# Patient Record
Sex: Male | Born: 1960 | Race: Black or African American | Hispanic: No | Marital: Single
Health system: Southern US, Community
[De-identification: ages and names within clinical notes are randomized; demographics above are authoritative.]

---

## 2006-09-02 ENCOUNTER — Emergency Department (HOSPITAL_COMMUNITY): Admission: EM | Admit: 2006-09-02 | Discharge: 2006-09-02 | Payer: Self-pay | Admitting: Emergency Medicine

## 2006-12-17 ENCOUNTER — Emergency Department (HOSPITAL_COMMUNITY): Admission: EM | Admit: 2006-12-17 | Discharge: 2006-12-17 | Payer: Self-pay | Admitting: Emergency Medicine

## 2007-01-01 ENCOUNTER — Encounter: Admission: RE | Admit: 2007-01-01 | Discharge: 2007-01-01 | Payer: Self-pay | Admitting: Occupational Medicine
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
## 2007-09-19 IMAGING — CR DG CHEST 1V
1 series · 1 of 1 positions shown · non-contrast
Comparison: none

CLINICAL DATA: Smoker.  Pre-employment physical. 
 CHEST - 1 VIEW:

[view not recorded]
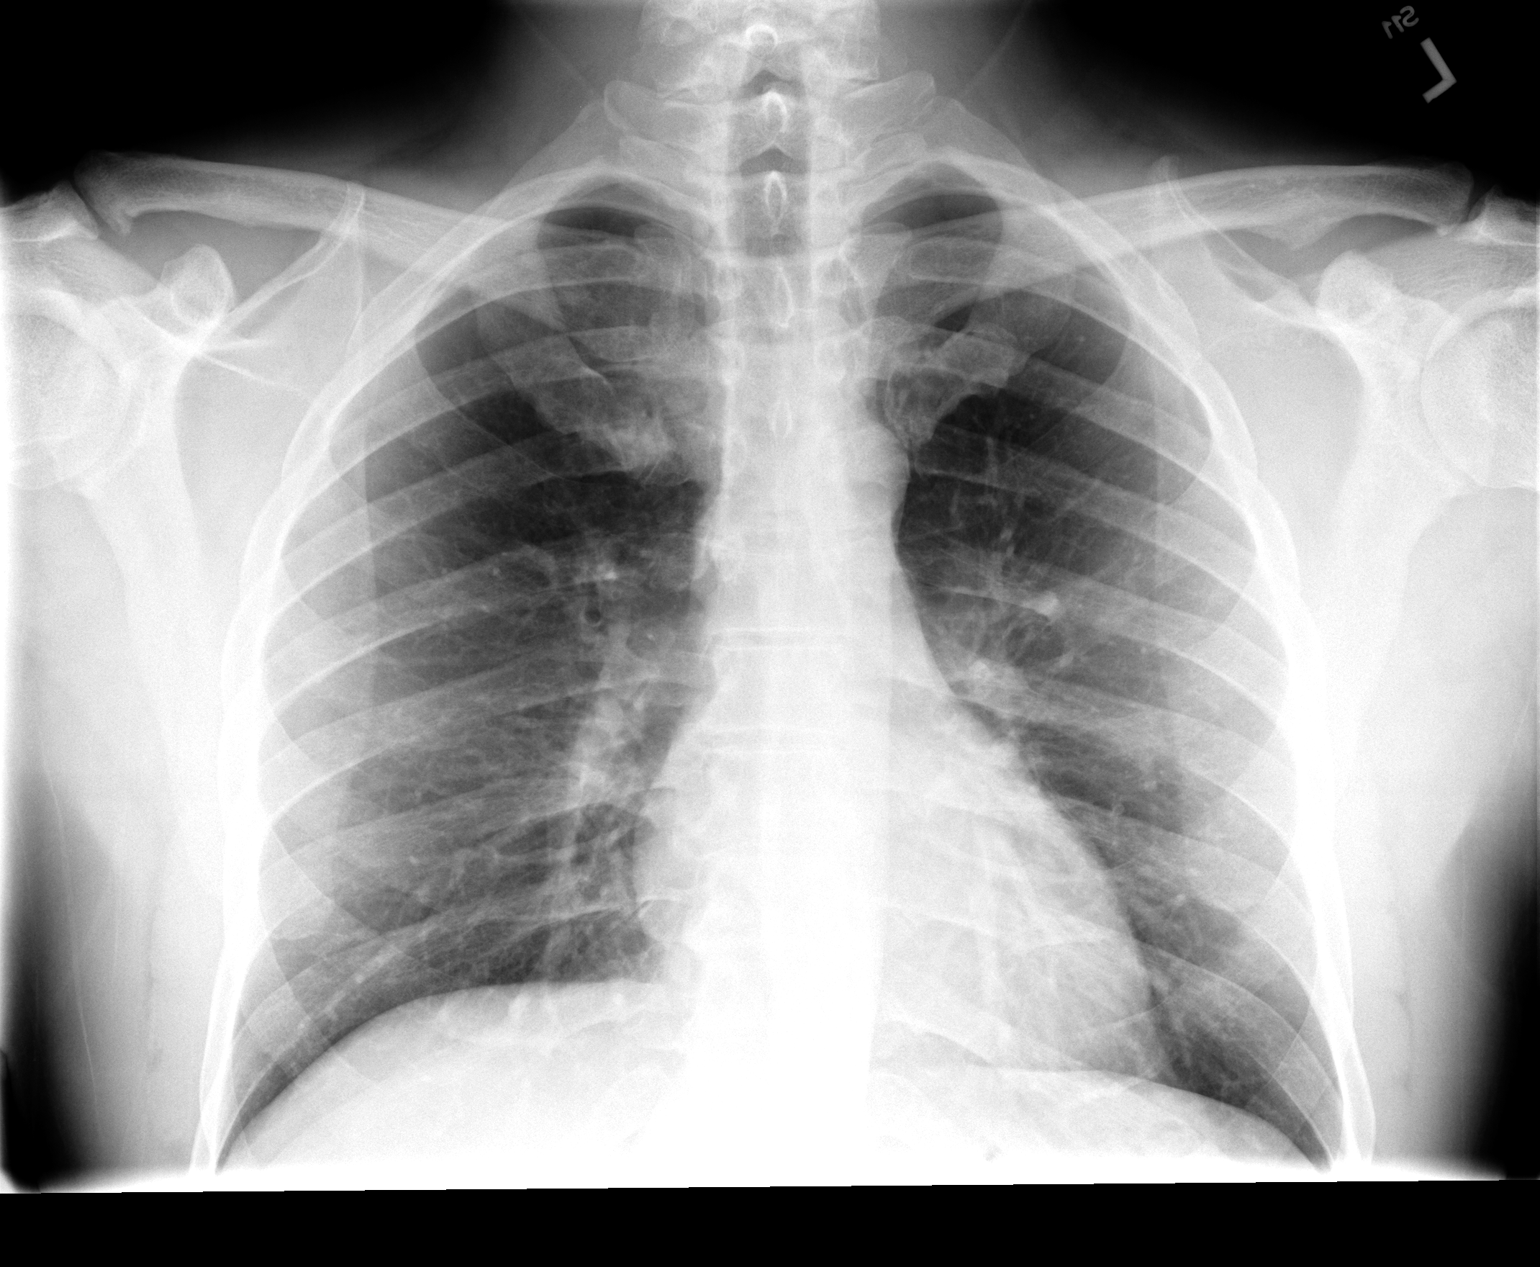

[1 of 1 positions shown; findings below may reference images not displayed]

FINDINGS: The heart size and mediastinal contours are within normal limits.  Both lungs are clear.
IMPRESSION: No active disease.

## 2010-06-29 ENCOUNTER — Encounter: Payer: Self-pay | Admitting: Internal Medicine

## 2010-08-17 ENCOUNTER — Ambulatory Visit: Payer: Self-pay | Admitting: Internal Medicine

## 2010-08-17 DIAGNOSIS — F17200 Nicotine dependence, unspecified, uncomplicated: Secondary | ICD-10-CM

## 2010-08-17 DIAGNOSIS — J309 Allergic rhinitis, unspecified: Secondary | ICD-10-CM | POA: Insufficient documentation

## 2010-08-17 DIAGNOSIS — J45909 Unspecified asthma, uncomplicated: Secondary | ICD-10-CM | POA: Insufficient documentation

## 2010-09-03 ENCOUNTER — Ambulatory Visit: Payer: Self-pay | Admitting: Internal Medicine

## 2010-12-11 NOTE — Assessment & Plan Note (Signed)
Summary: Pulmonary new pt eval/ abn spirometry   Visit Type:  Initial Consult Copy to:  Dr. Fleet Contras Primary Provider/Referring Provider:  Dr. Fleet Contras  CC:  Abnormal PFT.  History of Present Illness: 69 yobm active smoker wears respirator for environment cleanups since 2008 did not pass spirometry in 2011 so referred to pulmonary clinic  August 17, 2010  1st pulmonary office eval cc mild hb only with spicey food new complaint also having at hs but no cough or sob.  does note some "wheezing " when he lies down at night but does not bother his sleep. no early am exac of cough or wheeze.  Pt denies any significant sore throat, dysphagia, itching, sneezing,  nasal congestion or excess secretions,  fever, chills, sweats, unintended wt loss, pleuritic or exertional cp, hempoptysis, change in activity tolerance  orthopnea pnd or leg swelling Pt also denies any obvious fluctuation in symptoms with weather or environmental change or other alleviating or aggravating factors.       Preventive Screening-Counseling & Management  Alcohol-Tobacco     Smoking Status: current     Smoking Cessation Counseling: yes  Current Medications (verified): 1)  None  Allergies (verified): No Known Drug Allergies  Past History:  Past Medical History: Allergic Rhinitis Asthms vs  COPD     - Spirometry 06/29/10 FEV1 2.17 (52%) ratio 67 before initating rx with symbicort August 17, 2010     - Repeat pft's on symbicort rec August 17, 2010   Family History: Emphysema- Brother  was smoker  Lung CA- Brother (smoker) Mother (was a smoker) Heart dz- Mother  Social History: Married Children Current smoker since 11.  Smokes 1/2 ppd. ETOH- beer every wk Environmental work Smoking Status:  current  Review of Systems       The patient complains of acid heartburn, indigestion, itching, and rash.  The patient denies shortness of breath with activity, shortness of breath at rest, productive cough,  non-productive cough, coughing up blood, chest pain, irregular heartbeats, loss of appetite, weight change, abdominal pain, difficulty swallowing, sore throat, tooth/dental problems, headaches, nasal congestion/difficulty breathing through nose, sneezing, ear ache, anxiety, depression, hand/feet swelling, joint stiffness or pain, change in color of mucus, and fever.    Vital Signs:  Patient profile:   50 year old male Height:      74 inches Weight:      222 pounds BMI:     28.61 O2 Sat:      97 % on Room air Temp:     98.2 degrees F oral Pulse rate:   59 / minute BP sitting:   120 / 78  (left arm)  Vitals Entered By: Vernie Murders (August 17, 2010 10:37 AM)  O2 Flow:  Room air  Physical Exam  Additional Exam:  amb bm nad wt 222 August 17, 2010 HEENT mild turbinate edema.  Oropharynx no thrush or excess pnd or cobblestoning.  No JVD or cervical adenopathy. Mild accessory muscle hypertrophy. Trachea midline, nl thryroid. Chest was hyperinflated by percussion with diminished breath sounds and moderate increased exp time with trace bilateral wheeze. Hoover sign positive at mid inspiration. Regular rate and rhythm without murmur gallop or rub or increase P2 or edema.  Abd: no hsm, nl excursion. Ext warm without cyanosis or clubbing.     CXR  Procedure date:  08/17/2010  Findings:      Comparison: 01/01/2007   Findings: Trachea is midline.  Heart size normal.  Lungs are clear. No pleural fluid.  Impression & Recommendations:  Problem # 1:  ASTHMA, UNSPECIFIED (ICD-493.90) vs copd early stage related to smoking     DDX of  difficult airways managment all start with A and  include Adherence, Ace Inhibitors, Acid Reflux, Active Sinus Disease, Alpha 1 Antitripsin deficiency, Anxiety masquerading as Airways dz,  ABPA,  allergy(esp in young), Aspiration (esp in elderly), Adverse effects of DPI,  Active smokers, plus one B  = Beta blocker use..   Adherence:  try symbicort,  I spent  extra time with the patient today explaining optimal mdi  technique.  This improved from  25 to 50%  Active smoking see #2  Acid reflux: diet reviewed  Problem # 2:  SMOKER (ICD-305.1)  Discussed but not ready to committ to quit at this point - emphasized risks involved in continuing smoking and that patient should consider these in the context of the cost of smoking relative to the benefit obtained.   I took this opportunity to educate the patient regarding the consequences of smoking in airway disorders based on all the data we have from the multiple national lung health studies indicating that smoking cessation, not choice of inhalers or physicians, is the most important aspect of care.    Orders: Consultation Level V [phone])  Medications Added to Medication List This Visit: 1)  Symbicort 160-4.5 Mcg/act Aero (Budesonide-formoterol fumarate) .... 2 puffs first thing  in am and 2 puffs again in pm about 12 hours later  Other Orders: T-2 View CXR ([id number])  Patient Instructions: 1)  Start symbicort 160 2 puffs first thing  in am and 2 puffs again in pm about 12 hours later  2)  Work on perfecting inhaler technique:  relax and blow all the way out then take a nice smooth deep breath back in, triggering the inhaler at same time you start breathing in  3)  No reason you can't wear a respirator and work  4)  Please schedule a follow-up appointment in 2  weeks, sooner if needed with PFT's on return

## 2010-12-11 NOTE — Miscellaneous (Signed)
Summary: Orders Update pft charges  Clinical Lists Changes  Orders: Added new Service order of Carbon Monoxide diffusing w/capacity (94720) - Signed Added new Service order of Lung Volumes (94240) - Signed Added new Service order of Spirometry (Pre & Post) (94060) - Signed 

## 2010-12-11 NOTE — Assessment & Plan Note (Signed)
Summary: Pulmonary/ final summary f/u ov with hfa 90%   Copy to:  Dr. Fleet Contras Primary Provider/Referring Provider:  Dr. Fleet Contras  CC:  Heart burn and wheezing- somewhat improved.  History of Present Illness: 34 yobm active smoker wears respirator for environment cleanups since 2008 did not pass spirometry in 2011 so referred to pulmonary clinic  August 17, 2010  1st pulmonary office eval cc mild hb only with spicey food new complaint also having at hs but no cough or sob.  does note some "wheezing " when he lies down at night but does not bother his sleep. no early am exac of cough or wheeze. Start symbicort 160 2 puffs first thing  in am and 2 puffs again in pm about 12 hours later  Work on perfecting inhaler technique: No reason you can't wear a respirator and work  September 03, 2010  ov  still smoking. Heart burn and wheezing- somewhat improved on symbicort, still some audible wheezing.  Pt denies any significant sore throat, dysphagia, itching, sneezing,  nasal congestion or excess secretions,  fever, chills, sweats, unintended wt loss, pleuritic or exertional cp, hempoptysis, change in activity tolerance  orthopnea pnd or leg swelling .  no cough   Current Medications (verified): 1)  Symbicort 160-4.5 Mcg/act  Aero (Budesonide-Formoterol Fumarate) .... 2 Puffs First Thing  in Am and 2 Puffs Again in Pm About 12 Hours Later  Allergies (verified): No Known Drug Allergies  Past History:  Past Medical History: Allergic Rhinitis Asthms vs  COPD     - Spirometry 06/29/10 FEV1 2.17 (52%) ratio 67 before initating rx with symbicort August 17, 2010     - Repeat pft's on symbicort September 03, 2010 wnl x DLC0 56%  > corrects to 152, insp loop truncated  Clinical Reports Reviewed:  CXR:  08/17/2010: CXR Results:  Comparison: 01/01/2007   Findings: Trachea is midline.  Heart size normal.  Lungs are clear. No pleural fluid.   Vital Signs:  Patient profile:   50 year old  male Weight:      221 pounds O2 Sat:      96 % on Room air Temp:     98.2 degrees F oral Pulse rate:   77 / minute BP sitting:   110 / 68  (left arm)  Vitals Entered By: Vernie Murders (September 03, 2010 12:08 PM)  O2 Flow:  Room air  Physical Exam  Additional Exam:  amb bm nad with classic pseudowheeze resolves with purse lip maneuver  wt 222 August 17, 2010 > 221 September 03, 2010  HEENT mild turbinate edema.  Oropharynx no thrush or excess pnd or cobblestoning.  No JVD or cervical adenopathy. Mild accessory muscle hypertrophy. Trachea midline, nl thryroid. Chest was hyperinflated by percussion with diminished breath sounds and moderate increased exp time with trace bilateral wheeze. Hoover sign positive at mid inspiration. Regular rate and rhythm without murmur gallop or rub or increase P2 or edema.  Abd: no hsm, nl excursion. Ext warm without cyanosis or clubbing.     Impression & Recommendations:  Problem # 1:  ASTHMA, UNSPECIFIED (ICD-493.90) Normalization of pft's on symbicort excludes copd.  I spent extra time with the patient today explaining optimal mdi  technique.  This improved from  50-90%   Pseudowheeze is likely GERD related  Problem # 2:  SMOKER (ICD-305.1)  Discussed but not ready to committ to quit at this point - emphasized risks involved in continuing smoking and  that patient should consider these in the context of the cost of smoking relative to the benefit obtained.   I took this opportunity to educate the patient regarding the consequences of smoking in airway disorders based on all the data we have from the multiple national lung health studies indicating that smoking cessation, not choice of inhalers or physicians, is the most important aspect of care.    I reviewed the Flethcher curve with him  that basically says if you quit smoking when your best day FEV1 is still well preserved it is highly unlikely you will progress to severe disease and informed the  patient there was no medication on the market that has proven to change the curve or the likelihood of progression   Orders: Est. Patient Level IV (16109)  Patient Instructions: 1)  You minimal COPD which will progress regardless of what medication you take or what doctor  2)  Stay on symbicort 160 2 puffs first thing  in am and 2 puffs again in pm about 12 hours later  3)  GERD (REFLUX)  is a common cause of respiratory symptoms. It commonly presents without heartburn and can be treated with medication, but also with lifestyle changes including avoidance of late meals, excessive alcohol, smoking cessation, and avoid fatty foods, chocolate, peppermint, colas, red wine, and acidic juices such as orange juice. NO MINT OR MENTHOL PRODUCTS SO NO COUGH DROPS  4)  USE SUGARLESS CANDY INSTEAD (jolley ranchers)  5)  NO OIL BASED VITAMINS  6)  Prilosec can be used Take  one 30-60 min before first meal of the day x 4 weeks (= prevacid 30) 7)  Committ to quit smoking  Prescriptions: SYMBICORT 160-4.5 MCG/ACT  AERO (BUDESONIDE-FORMOTEROL FUMARATE) 2 puffs first thing  in am and 2 puffs again in pm about 12 hours later  #1 x 11   Entered and Authorized by:   Nyoka Cowden MD   Signed by:   Nyoka Cowden MD on 09/03/2010   Method used:   Electronically to        [facility] [phone]* (retail)       [address]       [place], [place]  [address]       Ph: [phone]       Fax: [phone]   RxID:   [id number]

## 2011-05-05 IMAGING — CR DG CHEST 2V
2 series · 2 of 2 positions shown · non-contrast
Comparison: 01/01/2007

CLINICAL DATA: Wheezing, asthma, smoker.

CHEST - 2 VIEW

[view not recorded (1 of 2)]
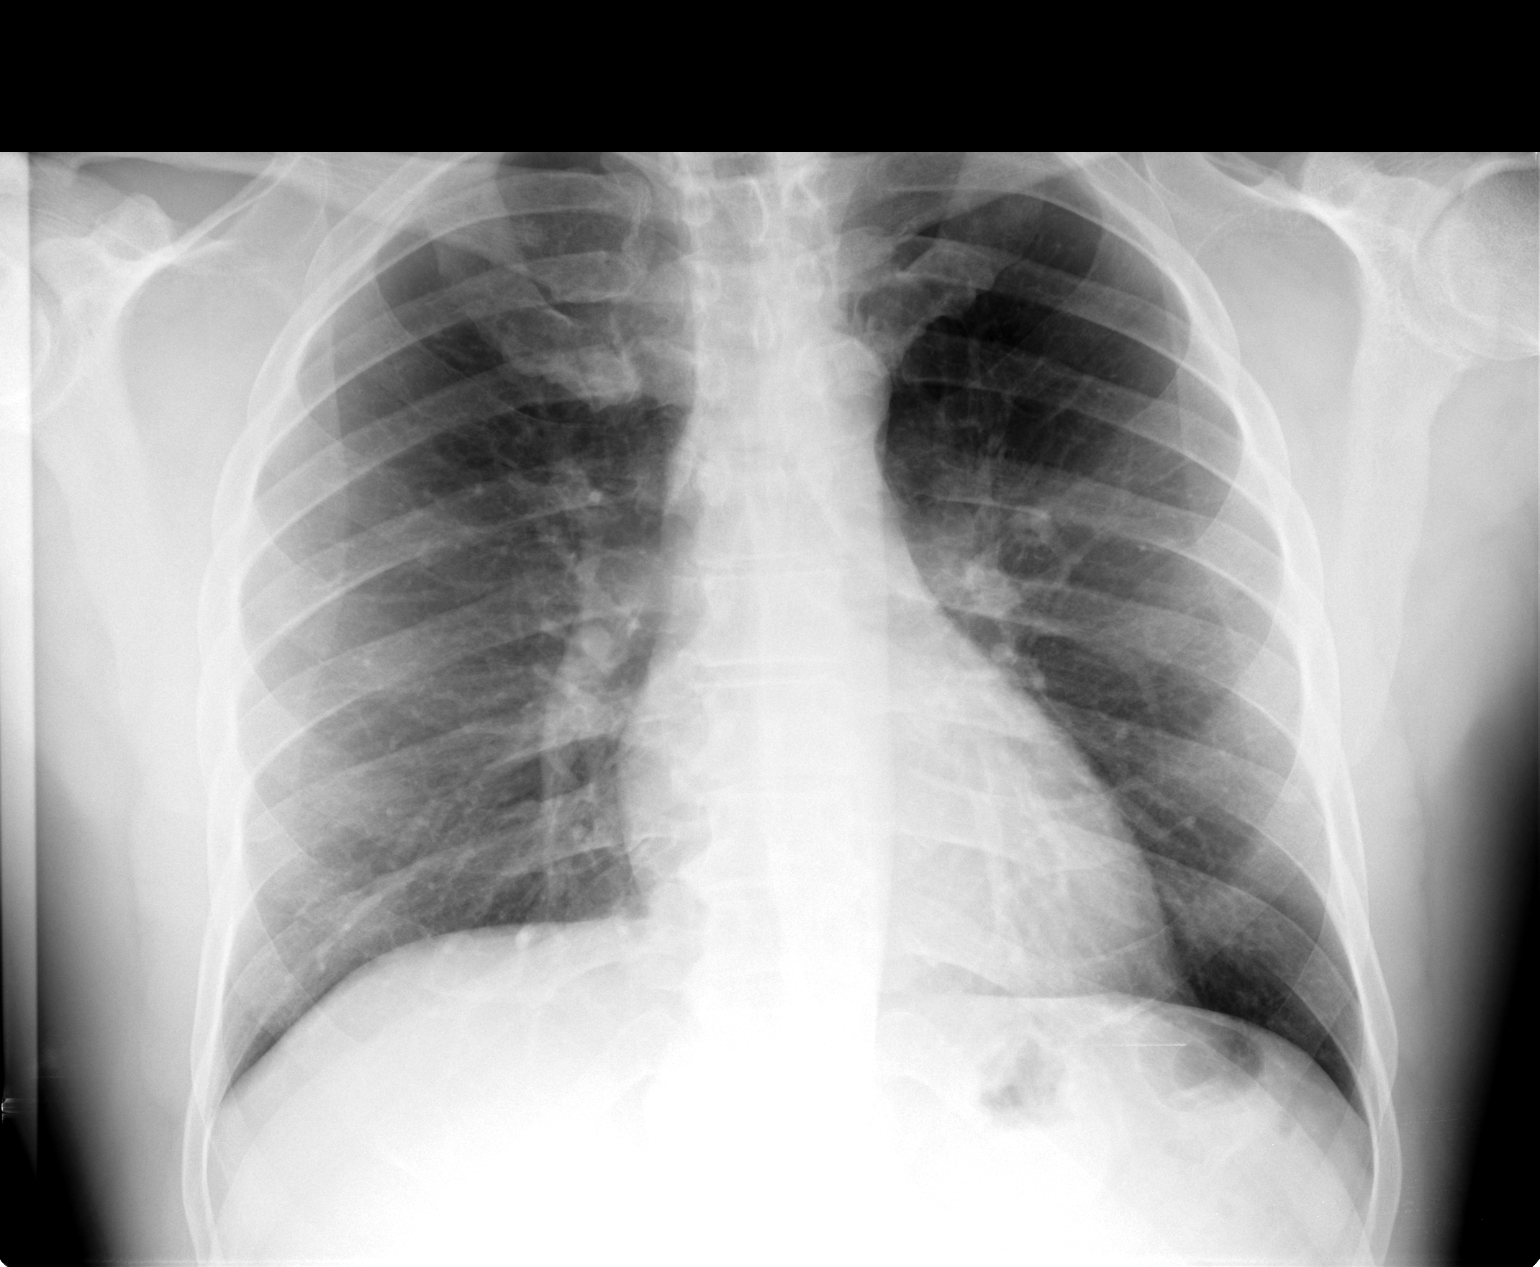

[view not recorded (2 of 2)]
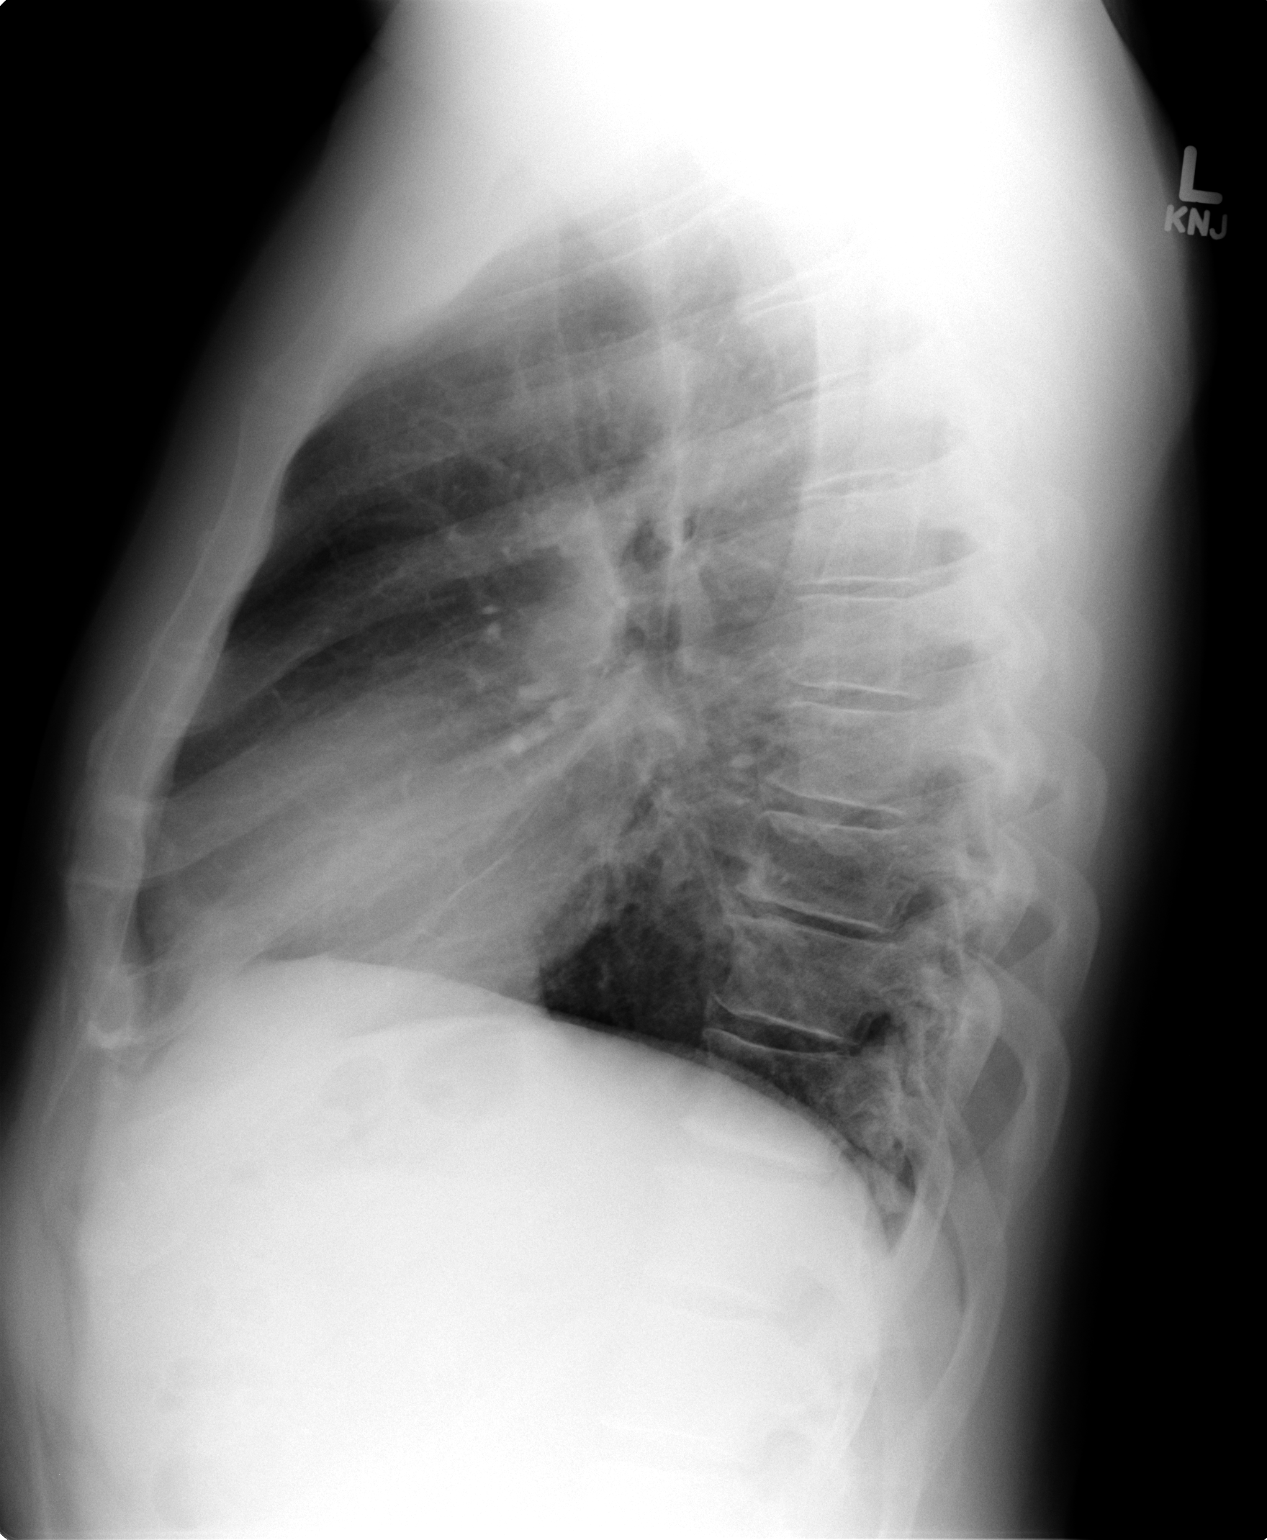

[2 of 2 positions shown; findings below may reference images not displayed]

FINDINGS: Trachea is midline.  Heart size normal.  Lungs are clear.
No pleural fluid.
IMPRESSION: No acute findings.

## 2016-12-10 ENCOUNTER — Other Ambulatory Visit: Payer: Self-pay | Admitting: Internal Medicine

## 2016-12-10 DIAGNOSIS — R42 Dizziness and giddiness: Secondary | ICD-10-CM

## 2016-12-17 ENCOUNTER — Other Ambulatory Visit: Payer: Self-pay

## 2017-08-15 DIAGNOSIS — H1089 Other conjunctivitis: Secondary | ICD-10-CM | POA: Diagnosis not present

## 2018-11-20 DIAGNOSIS — J40 Bronchitis, not specified as acute or chronic: Secondary | ICD-10-CM | POA: Diagnosis not present

## 2018-12-25 DIAGNOSIS — L304 Erythema intertrigo: Secondary | ICD-10-CM | POA: Diagnosis not present

## 2018-12-25 DIAGNOSIS — L308 Other specified dermatitis: Secondary | ICD-10-CM | POA: Diagnosis not present

## 2019-05-31 ENCOUNTER — Other Ambulatory Visit (HOSPITAL_COMMUNITY): Payer: Self-pay | Admitting: Internal Medicine

## 2019-05-31 DIAGNOSIS — R52 Pain, unspecified: Secondary | ICD-10-CM

## 2019-05-31 DIAGNOSIS — M79602 Pain in left arm: Secondary | ICD-10-CM | POA: Diagnosis not present

## 2019-05-31 DIAGNOSIS — Z7189 Other specified counseling: Secondary | ICD-10-CM | POA: Diagnosis not present

## 2019-06-01 ENCOUNTER — Other Ambulatory Visit: Payer: Self-pay

## 2019-06-01 ENCOUNTER — Ambulatory Visit (HOSPITAL_COMMUNITY)
Admission: RE | Admit: 2019-06-01 | Discharge: 2019-06-01 | Disposition: A | Discharge status: Home / Self Care | Payer: BC Managed Care – PPO | Source: Ambulatory Visit | Attending: Internal Medicine | Admitting: Internal Medicine

## 2019-06-01 DIAGNOSIS — M79602 Pain in left arm: Secondary | ICD-10-CM | POA: Insufficient documentation

## 2019-06-01 DIAGNOSIS — M7989 Other specified soft tissue disorders: Secondary | ICD-10-CM | POA: Diagnosis not present

## 2019-06-01 DIAGNOSIS — R52 Pain, unspecified: Secondary | ICD-10-CM

## 2019-06-01 NOTE — Progress Notes (Signed)
Left upper extremity venous duplex completed. Preliminary results in Chart review CV Proc. [facility], RVS 06/01/2019,9:34 AM

## 2019-06-02 DIAGNOSIS — M25512 Pain in left shoulder: Secondary | ICD-10-CM | POA: Diagnosis not present

## 2019-08-03 DIAGNOSIS — M79602 Pain in left arm: Secondary | ICD-10-CM | POA: Diagnosis not present

## 2019-08-03 DIAGNOSIS — E291 Testicular hypofunction: Secondary | ICD-10-CM | POA: Diagnosis not present

## 2019-08-03 DIAGNOSIS — Z0000 Encounter for general adult medical examination without abnormal findings: Secondary | ICD-10-CM | POA: Diagnosis not present

## 2019-08-09 DIAGNOSIS — M25512 Pain in left shoulder: Secondary | ICD-10-CM | POA: Diagnosis not present

## 2019-08-27 DIAGNOSIS — M65222 Calcific tendinitis, left upper arm: Secondary | ICD-10-CM | POA: Diagnosis not present

## 2019-08-27 DIAGNOSIS — M7542 Impingement syndrome of left shoulder: Secondary | ICD-10-CM | POA: Diagnosis not present

## 2019-08-31 DIAGNOSIS — M7542 Impingement syndrome of left shoulder: Secondary | ICD-10-CM | POA: Diagnosis not present

## 2019-08-31 DIAGNOSIS — M65222 Calcific tendinitis, left upper arm: Secondary | ICD-10-CM | POA: Diagnosis not present

## 2019-09-08 DIAGNOSIS — Z1211 Encounter for screening for malignant neoplasm of colon: Secondary | ICD-10-CM | POA: Diagnosis not present

## 2019-09-10 DIAGNOSIS — M65222 Calcific tendinitis, left upper arm: Secondary | ICD-10-CM | POA: Diagnosis not present

## 2019-09-10 DIAGNOSIS — M7542 Impingement syndrome of left shoulder: Secondary | ICD-10-CM | POA: Diagnosis not present

## 2019-09-23 DIAGNOSIS — Z1211 Encounter for screening for malignant neoplasm of colon: Secondary | ICD-10-CM | POA: Diagnosis not present

## 2019-09-27 DIAGNOSIS — M65222 Calcific tendinitis, left upper arm: Secondary | ICD-10-CM | POA: Diagnosis not present

## 2019-09-28 DIAGNOSIS — Z125 Encounter for screening for malignant neoplasm of prostate: Secondary | ICD-10-CM | POA: Diagnosis not present

## 2019-09-28 DIAGNOSIS — E291 Testicular hypofunction: Secondary | ICD-10-CM | POA: Diagnosis not present

## 2019-09-28 DIAGNOSIS — Z0000 Encounter for general adult medical examination without abnormal findings: Secondary | ICD-10-CM | POA: Diagnosis not present

## 2021-04-02 ENCOUNTER — Other Ambulatory Visit: Payer: Self-pay | Admitting: Family Medicine

## 2021-04-02 DIAGNOSIS — F17200 Nicotine dependence, unspecified, uncomplicated: Secondary | ICD-10-CM

## 2021-10-10 ENCOUNTER — Other Ambulatory Visit: Payer: Self-pay | Admitting: Family Medicine

## 2021-10-10 DIAGNOSIS — F17200 Nicotine dependence, unspecified, uncomplicated: Secondary | ICD-10-CM

## 2021-11-15 ENCOUNTER — Inpatient Hospital Stay: Admission: RE | Admit: 2021-11-15 | Payer: 59 | Source: Ambulatory Visit

## 2023-01-13 ENCOUNTER — Other Ambulatory Visit (HOSPITAL_COMMUNITY): Payer: Self-pay | Admitting: Family Medicine

## 2023-01-13 DIAGNOSIS — F17200 Nicotine dependence, unspecified, uncomplicated: Secondary | ICD-10-CM

## 2023-01-27 ENCOUNTER — Other Ambulatory Visit: Payer: Self-pay | Admitting: Family Medicine

## 2023-01-27 DIAGNOSIS — F17200 Nicotine dependence, unspecified, uncomplicated: Secondary | ICD-10-CM

## 2024-01-14 ENCOUNTER — Other Ambulatory Visit: Payer: Self-pay | Admitting: Family Medicine

## 2024-01-14 DIAGNOSIS — F17200 Nicotine dependence, unspecified, uncomplicated: Secondary | ICD-10-CM
